# Patient Record
Sex: Female | Born: 1990 | Race: Black or African American | Hispanic: No | Smoking: Never smoker
Health system: Southern US, Community
[De-identification: ages and names within clinical notes are randomized; demographics above are authoritative.]

---

## 2013-09-30 ENCOUNTER — Emergency Department: Payer: Self-pay | Admitting: Emergency Medicine

## 2013-09-30 LAB — URINALYSIS, COMPLETE
Bilirubin,UR: NEGATIVE
Glucose,UR: NEGATIVE mg/dL (ref 0–75)
Nitrite: POSITIVE
Ph: 5 (ref 4.5–8.0)
Protein: 30
Specific Gravity: 1.034 (ref 1.003–1.030)
Squamous Epithelial: 3

## 2013-09-30 LAB — PREGNANCY, URINE: Pregnancy Test, Urine: NEGATIVE m[IU]/mL

## 2013-10-03 LAB — URINE CULTURE

## 2014-08-27 ENCOUNTER — Encounter (HOSPITAL_COMMUNITY): Payer: Self-pay | Admitting: Emergency Medicine

## 2014-08-27 ENCOUNTER — Inpatient Hospital Stay (HOSPITAL_COMMUNITY)
Admission: EM | Admit: 2014-08-27 | Discharge: 2014-09-05 | DRG: 957 | Disposition: A | Discharge status: Home / Self Care | Payer: 59 | Attending: Surgery | Admitting: Surgery

## 2014-08-27 DIAGNOSIS — D62 Acute posthemorrhagic anemia: Secondary | ICD-10-CM | POA: Diagnosis not present

## 2014-08-27 DIAGNOSIS — M546 Pain in thoracic spine: Secondary | ICD-10-CM

## 2014-08-27 DIAGNOSIS — R198 Other specified symptoms and signs involving the digestive system and abdomen: Secondary | ICD-10-CM

## 2014-08-27 DIAGNOSIS — S27329A Contusion of lung, unspecified, initial encounter: Secondary | ICD-10-CM | POA: Diagnosis present

## 2014-08-27 DIAGNOSIS — M79601 Pain in right arm: Secondary | ICD-10-CM | POA: Diagnosis present

## 2014-08-27 DIAGNOSIS — K659 Peritonitis, unspecified: Secondary | ICD-10-CM | POA: Diagnosis present

## 2014-08-27 DIAGNOSIS — M545 Low back pain, unspecified: Secondary | ICD-10-CM

## 2014-08-27 DIAGNOSIS — S36400A Unspecified injury of duodenum, initial encounter: Secondary | ICD-10-CM

## 2014-08-27 DIAGNOSIS — S36509A Unspecified injury of unspecified part of colon, initial encounter: Secondary | ICD-10-CM | POA: Diagnosis present

## 2014-08-27 DIAGNOSIS — S36420A Contusion of duodenum, initial encounter: Principal | ICD-10-CM | POA: Diagnosis present

## 2014-08-27 DIAGNOSIS — Z882 Allergy status to sulfonamides status: Secondary | ICD-10-CM

## 2014-08-27 DIAGNOSIS — S36520A Contusion of ascending [right] colon, initial encounter: Secondary | ICD-10-CM | POA: Diagnosis present

## 2014-08-27 DIAGNOSIS — K567 Ileus, unspecified: Secondary | ICD-10-CM | POA: Diagnosis present

## 2014-08-27 MED ORDER — SODIUM CHLORIDE 0.9 % IV BOLUS (SEPSIS)
1000.0000 mL | Freq: Once | INTRAVENOUS | Status: AC
Start: 2014-08-28 — End: 2014-08-28
  Administered 2014-08-28: 1000 mL via INTRAVENOUS

## 2014-08-27 MED ORDER — FENTANYL CITRATE (PF) 100 MCG/2ML IJ SOLN
50.0000 ug | Freq: Once | INTRAMUSCULAR | Status: AC
  Administered 2014-08-28: 50 ug via INTRAVENOUS
  Filled 2014-08-27: qty 2
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
## 2014-08-28 ENCOUNTER — Inpatient Hospital Stay (HOSPITAL_COMMUNITY): Payer: 59 | Admitting: Certified Registered"

## 2014-08-28 ENCOUNTER — Emergency Department (HOSPITAL_COMMUNITY): Payer: 59

## 2014-08-28 ENCOUNTER — Encounter (HOSPITAL_COMMUNITY): Admission: EM | Disposition: A | Discharge status: Home / Self Care | Payer: Self-pay | Source: Home / Self Care

## 2014-08-28 ENCOUNTER — Encounter (HOSPITAL_COMMUNITY): Payer: Self-pay | Admitting: Radiology

## 2014-08-28 DIAGNOSIS — Z882 Allergy status to sulfonamides status: Secondary | ICD-10-CM | POA: Diagnosis not present

## 2014-08-28 DIAGNOSIS — S36520A Contusion of ascending [right] colon, initial encounter: Secondary | ICD-10-CM | POA: Diagnosis present

## 2014-08-28 DIAGNOSIS — M79601 Pain in right arm: Secondary | ICD-10-CM | POA: Diagnosis present

## 2014-08-28 DIAGNOSIS — S27329A Contusion of lung, unspecified, initial encounter: Secondary | ICD-10-CM | POA: Diagnosis present

## 2014-08-28 DIAGNOSIS — D62 Acute posthemorrhagic anemia: Secondary | ICD-10-CM | POA: Diagnosis present

## 2014-08-28 DIAGNOSIS — M545 Low back pain: Secondary | ICD-10-CM | POA: Diagnosis present

## 2014-08-28 DIAGNOSIS — S36420A Contusion of duodenum, initial encounter: Secondary | ICD-10-CM | POA: Diagnosis present

## 2014-08-28 DIAGNOSIS — S36400A Unspecified injury of duodenum, initial encounter: Secondary | ICD-10-CM | POA: Diagnosis present

## 2014-08-28 DIAGNOSIS — K659 Peritonitis, unspecified: Secondary | ICD-10-CM | POA: Diagnosis present

## 2014-08-28 DIAGNOSIS — K567 Ileus, unspecified: Secondary | ICD-10-CM | POA: Diagnosis present

## 2014-08-28 HISTORY — PX: LAPAROTOMY: SHX154

## 2014-08-28 LAB — COMPREHENSIVE METABOLIC PANEL
ALBUMIN: 3.2 g/dL — AB (ref 3.5–5.0)
ALK PHOS: 71 U/L (ref 38–126)
ALK PHOS: 47 U/L (ref 38–126)
ALT: 59 U/L — AB (ref 14–54)
ALT: 54 U/L (ref 14–54)
ANION GAP: 13 (ref 5–15)
AST: 120 U/L — AB (ref 15–41)
AST: 125 U/L — ABNORMAL HIGH (ref 15–41)
Albumin: 4.1 g/dL (ref 3.5–5.0)
Anion gap: 8 (ref 5–15)
BUN: 9 mg/dL (ref 6–20)
BUN: 5 mg/dL — AB (ref 6–20)
CALCIUM: 8.9 mg/dL (ref 8.9–10.3)
CHLORIDE: 105 mmol/L (ref 101–111)
CO2: 19 mmol/L — ABNORMAL LOW (ref 22–32)
CO2: 22 mmol/L (ref 22–32)
Calcium: 7.6 mg/dL — ABNORMAL LOW (ref 8.9–10.3)
Chloride: 105 mmol/L (ref 101–111)
Creatinine, Ser: 0.89 mg/dL (ref 0.44–1.00)
Creatinine, Ser: 0.79 mg/dL (ref 0.44–1.00)
GFR calc non Af Amer: >60 mL/min (ref >60)
Glucose, Bld: 142 mg/dL — ABNORMAL HIGH (ref 65–99)
Glucose, Bld: 173 mg/dL — ABNORMAL HIGH (ref 65–99)
POTASSIUM: 3.7 mmol/L (ref 3.5–5.1)
Potassium: 2.7 mmol/L — CL (ref 3.5–5.1)
Sodium: 137 mmol/L (ref 135–145)
Sodium: 135 mmol/L (ref 135–145)
TOTAL PROTEIN: 5.2 g/dL — AB (ref 6.5–8.1)
Total Bilirubin: 0.3 mg/dL (ref 0.3–1.2)
Total Bilirubin: 1 mg/dL (ref 0.3–1.2)
Total Protein: 7.5 g/dL (ref 6.5–8.1)

## 2014-08-28 LAB — CBC
HCT: 33.4 % — ABNORMAL LOW (ref 36.0–46.0)
Hemoglobin: 11.9 g/dL — ABNORMAL LOW (ref 12.0–15.0)
MCH: 28.2 pg (ref 26.0–34.0)
MCHC: 35.6 g/dL (ref 30.0–36.0)
MCV: 79.1 fL (ref 78.0–100.0)
PLATELETS: 262 10*3/uL (ref 150–400)
RBC: 4.22 MIL/uL (ref 3.87–5.11)
RDW: 14.5 % (ref 11.5–15.5)
WBC: 15.2 10*3/uL — AB (ref 4.0–10.5)

## 2014-08-28 LAB — PROTIME-INR
INR: 1.11 (ref 0.00–1.49)
Prothrombin Time: 14.5 seconds (ref 11.6–15.2)

## 2014-08-28 LAB — URINE MICROSCOPIC-ADD ON

## 2014-08-28 LAB — URINALYSIS, ROUTINE W REFLEX MICROSCOPIC
Bilirubin Urine: NEGATIVE
GLUCOSE, UA: 100 mg/dL — AB
Ketones, ur: NEGATIVE mg/dL
Leukocytes, UA: NEGATIVE
Nitrite: NEGATIVE
Protein, ur: NEGATIVE mg/dL
Specific Gravity, Urine: 1.028 (ref 1.005–1.030)
Urobilinogen, UA: 0.2 mg/dL (ref 0.0–1.0)
pH: 7 (ref 5.0–8.0)

## 2014-08-28 LAB — CBC WITH DIFFERENTIAL/PLATELET
Basophils Absolute: 0 10*3/uL (ref 0.0–0.1)
Basophils Relative: 0 % (ref 0–1)
Eosinophils Absolute: 0.3 10*3/uL (ref 0.0–0.7)
Eosinophils Relative: 2 % (ref 0–5)
HEMATOCRIT: 35 % — AB (ref 36.0–46.0)
Hemoglobin: 12.6 g/dL (ref 12.0–15.0)
LYMPHS PCT: 38 % (ref 12–46)
Lymphs Abs: 6 10*3/uL — ABNORMAL HIGH (ref 0.7–4.0)
MCH: 28.3 pg (ref 26.0–34.0)
MCHC: 36 g/dL (ref 30.0–36.0)
MCV: 78.7 fL (ref 78.0–100.0)
MONOS PCT: 4 % (ref 3–12)
Monocytes Absolute: 0.6 10*3/uL (ref 0.1–1.0)
Neutro Abs: 9 10*3/uL — ABNORMAL HIGH (ref 1.7–7.7)
Neutrophils Relative %: 56 % (ref 43–77)
PLATELETS: 326 10*3/uL (ref 150–400)
RBC: 4.45 MIL/uL (ref 3.87–5.11)
RDW: 14.5 % (ref 11.5–15.5)
WBC: 15.9 10*3/uL — ABNORMAL HIGH (ref 4.0–10.5)

## 2014-08-28 LAB — I-STAT BETA HCG BLOOD, ED (MC, WL, AP ONLY)

## 2014-08-28 LAB — TROPONIN I: Troponin I: <0.03 ng/mL (ref <0.031)

## 2014-08-28 LAB — PREGNANCY, URINE: Preg Test, Ur: NEGATIVE

## 2014-08-28 LAB — APTT: APTT: 31 s (ref 24–37)

## 2014-08-28 SURGERY — LAPAROTOMY, EXPLORATORY
Anesthesia: General | Site: Abdomen

## 2014-08-28 MED ORDER — 0.9 % SODIUM CHLORIDE (POUR BTL) OPTIME
TOPICAL | Status: DC | PRN
  Administered 2014-08-28: 3000 mL

## 2014-08-28 MED ORDER — ONDANSETRON HCL 4 MG/2ML IJ SOLN
4.0000 mg | Freq: Four times a day (QID) | INTRAMUSCULAR | Status: DC | PRN
  Administered 2014-08-28 – 2014-09-01 (×4): 4 mg via INTRAVENOUS
  Filled 2014-08-28 (×4): qty 2

## 2014-08-28 MED ORDER — HYDROMORPHONE 0.3 MG/ML IV SOLN
INTRAVENOUS | Status: AC
Start: 2014-08-28 — End: 2014-08-28
  Filled 2014-08-28: qty 25

## 2014-08-28 MED ORDER — OXYCODONE HCL 5 MG PO TABS: 5.0000 mg | ORAL_TABLET | Freq: Once | ORAL | Status: DC | PRN

## 2014-08-28 MED ORDER — FENTANYL CITRATE (PF) 100 MCG/2ML IJ SOLN
INTRAMUSCULAR | Status: DC | PRN
  Administered 2014-08-28: 150 ug via INTRAVENOUS
  Administered 2014-08-28: 100 ug via INTRAVENOUS
  Administered 2014-08-28: 50 ug via INTRAVENOUS
  Administered 2014-08-28 (×2): 100 ug via INTRAVENOUS

## 2014-08-28 MED ORDER — NALOXONE HCL 0.4 MG/ML IJ SOLN: 0.4000 mg | INTRAMUSCULAR | Status: DC | PRN

## 2014-08-28 MED ORDER — ENOXAPARIN SODIUM 40 MG/0.4ML ~~LOC~~ SOLN
40.0000 mg | SUBCUTANEOUS | Status: DC
  Administered 2014-08-28 – 2014-09-04 (×8): 40 mg via SUBCUTANEOUS
  Filled 2014-08-28 (×8): qty 0.4

## 2014-08-28 MED ORDER — DIPHENHYDRAMINE HCL 50 MG/ML IJ SOLN
12.5000 mg | Freq: Four times a day (QID) | INTRAMUSCULAR | Status: DC | PRN
  Administered 2014-08-28 – 2014-09-01 (×4): 12.5 mg via INTRAVENOUS
  Filled 2014-08-28 (×4): qty 1

## 2014-08-28 MED ORDER — DEXTROSE IN LACTATED RINGERS 5 % IV SOLN: INTRAVENOUS | Status: DC

## 2014-08-28 MED ORDER — LACTATED RINGERS IV BOLUS (SEPSIS): 1000.0000 mL | Freq: Once | INTRAVENOUS | Status: DC

## 2014-08-28 MED ORDER — POTASSIUM CHLORIDE 10 MEQ/100ML IV SOLN: 10.0000 meq | INTRAVENOUS | Status: AC

## 2014-08-28 MED ORDER — FENTANYL CITRATE (PF) 250 MCG/5ML IJ SOLN
INTRAMUSCULAR | Status: AC
  Filled 2014-08-28: qty 5

## 2014-08-28 MED ORDER — SODIUM CHLORIDE 0.9 % IV BOLUS (SEPSIS)
1000.0000 mL | Freq: Once | INTRAVENOUS | Status: AC
  Administered 2014-08-28: 1000 mL via INTRAVENOUS

## 2014-08-28 MED ORDER — PROMETHAZINE HCL 25 MG/ML IJ SOLN
12.5000 mg | INTRAMUSCULAR | Status: DC | PRN
  Administered 2014-08-28 – 2014-09-01 (×4): 12.5 mg via INTRAVENOUS
  Filled 2014-08-28 (×4): qty 1

## 2014-08-28 MED ORDER — PIPERACILLIN-TAZOBACTAM 3.375 G IVPB
3.3750 g | Freq: Three times a day (TID) | INTRAVENOUS | Status: DC
  Administered 2014-08-28 – 2014-09-02 (×15): 3.375 g via INTRAVENOUS
  Filled 2014-08-28 (×16): qty 50

## 2014-08-28 MED ORDER — PIPERACILLIN-TAZOBACTAM 3.375 G IVPB 30 MIN
3.3750 g | Freq: Once | INTRAVENOUS | Status: AC
  Administered 2014-08-28: 3.375 g via INTRAVENOUS
  Filled 2014-08-28: qty 50

## 2014-08-28 MED ORDER — ONDANSETRON HCL 4 MG/2ML IJ SOLN: 4.0000 mg | Freq: Four times a day (QID) | INTRAMUSCULAR | Status: DC | PRN

## 2014-08-28 MED ORDER — ROCURONIUM BROMIDE 100 MG/10ML IV SOLN
INTRAVENOUS | Status: DC | PRN
  Administered 2014-08-28 (×2): 10 mg via INTRAVENOUS
  Administered 2014-08-28: 20 mg via INTRAVENOUS
  Administered 2014-08-28: 10 mg via INTRAVENOUS

## 2014-08-28 MED ORDER — SUCCINYLCHOLINE CHLORIDE 20 MG/ML IJ SOLN
INTRAMUSCULAR | Status: DC | PRN
  Administered 2014-08-28: 100 mg via INTRAVENOUS

## 2014-08-28 MED ORDER — OXYCODONE HCL 5 MG/5ML PO SOLN: 5.0000 mg | Freq: Once | ORAL | Status: DC | PRN

## 2014-08-28 MED ORDER — LIDOCAINE HCL (CARDIAC) 20 MG/ML IV SOLN
INTRAVENOUS | Status: DC | PRN
  Administered 2014-08-28: 60 mg via INTRAVENOUS

## 2014-08-28 MED ORDER — NEOSTIGMINE METHYLSULFATE 10 MG/10ML IV SOLN
INTRAVENOUS | Status: DC | PRN
  Administered 2014-08-28: 3 mg via INTRAVENOUS

## 2014-08-28 MED ORDER — LACTATED RINGERS IV SOLN
INTRAVENOUS | Status: DC | PRN
  Administered 2014-08-28 (×3): via INTRAVENOUS

## 2014-08-28 MED ORDER — GLYCOPYRROLATE 0.2 MG/ML IJ SOLN
INTRAMUSCULAR | Status: DC | PRN
  Administered 2014-08-28: 0.4 mg via INTRAVENOUS

## 2014-08-28 MED ORDER — ONDANSETRON HCL 4 MG/2ML IJ SOLN
INTRAMUSCULAR | Status: DC | PRN
  Administered 2014-08-28: 4 mg via INTRAVENOUS

## 2014-08-28 MED ORDER — SODIUM CHLORIDE 0.9 % IJ SOLN: 9.0000 mL | INTRAMUSCULAR | Status: DC | PRN

## 2014-08-28 MED ORDER — HYDROMORPHONE 0.3 MG/ML IV SOLN
INTRAVENOUS | Status: DC
  Administered 2014-08-28: 0.3 mg via INTRAVENOUS
  Administered 2014-08-28: 1.25 mg via INTRAVENOUS
  Administered 2014-08-28: 1.5 mg via INTRAVENOUS
  Administered 2014-08-28: 07:00:00 via INTRAVENOUS
  Administered 2014-08-29: 0.6 mg via INTRAVENOUS
  Administered 2014-08-29: 0.9 mg via INTRAVENOUS
  Administered 2014-08-29: 22:00:00 via INTRAVENOUS
  Administered 2014-08-29: 1.2 mg via INTRAVENOUS
  Administered 2014-08-29 (×2): 0.9 mg via INTRAVENOUS
  Administered 2014-08-30 (×2): 0.6 mg via INTRAVENOUS
  Administered 2014-08-30: 0.9 mg via INTRAVENOUS
  Administered 2014-08-30: 2.4 mg via INTRAVENOUS
  Administered 2014-08-30: 1.2 mg via INTRAVENOUS
  Administered 2014-08-31: 0.9 mg via INTRAVENOUS
  Administered 2014-08-31: 21:00:00 via INTRAVENOUS
  Administered 2014-08-31: 0.6 mg via INTRAVENOUS
  Administered 2014-08-31: 1.5 mg via INTRAVENOUS
  Administered 2014-08-31: 06:00:00 via INTRAVENOUS
  Administered 2014-08-31: 1.9 mg via INTRAVENOUS
  Administered 2014-08-31: 2.1 mg via INTRAVENOUS
  Administered 2014-09-01: 0.5 mg via INTRAVENOUS
  Administered 2014-09-01: 1 mg via INTRAVENOUS
  Administered 2014-09-01: 1.49 mg via INTRAVENOUS
  Administered 2014-09-01: 1.99 mg via INTRAVENOUS
  Administered 2014-09-01: 1.49 mg via INTRAVENOUS
  Administered 2014-09-02 (×2): 0.6 mg via INTRAVENOUS
  Filled 2014-08-28 (×5): qty 25

## 2014-08-28 MED ORDER — DIPHENHYDRAMINE HCL 12.5 MG/5ML PO ELIX: 12.5000 mg | ORAL_SOLUTION | Freq: Four times a day (QID) | ORAL | Status: DC | PRN

## 2014-08-28 MED ORDER — PHENOL 1.4 % MT LIQD
1.0000 | OROMUCOSAL | Status: DC | PRN
  Filled 2014-08-28: qty 177

## 2014-08-28 MED ORDER — HYDROMORPHONE HCL 1 MG/ML IJ SOLN
1.0000 mg | INTRAMUSCULAR | Status: DC | PRN
  Administered 2014-08-28: 1 mg via INTRAVENOUS
  Filled 2014-08-28: qty 1

## 2014-08-28 MED ORDER — SODIUM CHLORIDE 0.9 % IV SOLN: 12.5000 mg | INTRAVENOUS | Status: DC | PRN

## 2014-08-28 MED ORDER — HYDROMORPHONE HCL 1 MG/ML IJ SOLN
1.0000 mg | Freq: Once | INTRAMUSCULAR | Status: AC
  Administered 2014-08-28: 1 mg via INTRAVENOUS
  Filled 2014-08-28: qty 1

## 2014-08-28 MED ORDER — PROPOFOL 10 MG/ML IV BOLUS
INTRAVENOUS | Status: AC
  Filled 2014-08-28: qty 20

## 2014-08-28 MED ORDER — PROPOFOL 10 MG/ML IV BOLUS
INTRAVENOUS | Status: DC | PRN
  Administered 2014-08-28: 140 mg via INTRAVENOUS

## 2014-08-28 MED ORDER — IOHEXOL 300 MG/ML  SOLN
100.0000 mL | Freq: Once | INTRAMUSCULAR | Status: AC | PRN
  Administered 2014-08-28: 100 mL via INTRAVENOUS

## 2014-08-28 MED ORDER — HYDROMORPHONE HCL 1 MG/ML IJ SOLN: 0.2500 mg | INTRAMUSCULAR | Status: DC | PRN

## 2014-08-28 MED ORDER — KCL IN DEXTROSE-NACL 20-5-0.9 MEQ/L-%-% IV SOLN
INTRAVENOUS | Status: DC
  Administered 2014-08-28 – 2014-09-04 (×11): via INTRAVENOUS
  Administered 2014-09-05: 1 mL via INTRAVENOUS
  Filled 2014-08-28 (×20): qty 1000

## 2014-08-28 SURGICAL SUPPLY — 59 items
BNDG GAUZE ELAST 4 BULKY (GAUZE/BANDAGES/DRESSINGS) ×3 IMPLANT
CANISTER SUCTION 2500CC (MISCELLANEOUS) ×3 IMPLANT
CHLORAPREP W/TINT 26ML (MISCELLANEOUS) ×3 IMPLANT
COVER MAYO STAND STRL (DRAPES) IMPLANT
COVER SURGICAL LIGHT HANDLE (MISCELLANEOUS) ×3 IMPLANT
DRAIN CHANNEL 19F RND (DRAIN) ×3 IMPLANT
DRAPE LAPAROSCOPIC ABDOMINAL (DRAPES) ×3 IMPLANT
DRAPE PROXIMA HALF (DRAPES) IMPLANT
DRAPE WARM FLUID 44X44 (DRAPE) ×3 IMPLANT
DRSG OPSITE POSTOP 4X10 (GAUZE/BANDAGES/DRESSINGS) IMPLANT
DRSG OPSITE POSTOP 4X8 (GAUZE/BANDAGES/DRESSINGS) IMPLANT
ELECT BLADE 6.5 EXT (BLADE) IMPLANT
ELECT REM PT RETURN 9FT ADLT (ELECTROSURGICAL) ×3
ELECTRODE REM PT RTRN 9FT ADLT (ELECTROSURGICAL) ×1 IMPLANT
EVACUATOR SILICONE 100CC (DRAIN) ×3 IMPLANT
GLOVE BIO SURGEON STRL SZ8 (GLOVE) ×6 IMPLANT
GLOVE BIOGEL PI IND STRL 6.5 (GLOVE) ×1 IMPLANT
GLOVE BIOGEL PI IND STRL 7.5 (GLOVE) ×1 IMPLANT
GLOVE BIOGEL PI IND STRL 8 (GLOVE) ×2 IMPLANT
GLOVE BIOGEL PI INDICATOR 6.5 (GLOVE) ×2
GLOVE BIOGEL PI INDICATOR 7.5 (GLOVE) ×2
GLOVE BIOGEL PI INDICATOR 8 (GLOVE) ×4
GOWN STRL REUS W/ TWL LRG LVL3 (GOWN DISPOSABLE) ×2 IMPLANT
GOWN STRL REUS W/ TWL XL LVL3 (GOWN DISPOSABLE) ×1 IMPLANT
GOWN STRL REUS W/TWL LRG LVL3 (GOWN DISPOSABLE) ×4
GOWN STRL REUS W/TWL XL LVL3 (GOWN DISPOSABLE) ×2
KIT BASIN OR (CUSTOM PROCEDURE TRAY) ×3 IMPLANT
KIT ROOM TURNOVER OR (KITS) ×3 IMPLANT
LIGASURE IMPACT 36 18CM CVD LR (INSTRUMENTS) IMPLANT
NS IRRIG 1000ML POUR BTL (IV SOLUTION) ×6 IMPLANT
PACK GENERAL/GYN (CUSTOM PROCEDURE TRAY) ×3 IMPLANT
PAD ARMBOARD 7.5X6 YLW CONV (MISCELLANEOUS) ×3 IMPLANT
PENCIL BUTTON HOLSTER BLD 10FT (ELECTRODE) IMPLANT
RELOAD PROXIMATE 75MM BLUE (ENDOMECHANICALS) ×6 IMPLANT
SPECIMEN JAR LARGE (MISCELLANEOUS) IMPLANT
SPONGE GAUZE 4X4 12PLY STER LF (GAUZE/BANDAGES/DRESSINGS) ×3 IMPLANT
SPONGE LAP 18X18 X RAY DECT (DISPOSABLE) ×6 IMPLANT
STAPLER GUN LINEAR PROX 60 (STAPLE) ×3 IMPLANT
STAPLER PROXIMATE 75MM BLUE (STAPLE) ×3 IMPLANT
STAPLER VISISTAT 35W (STAPLE) ×3 IMPLANT
SUCTION POOLE TIP (SUCTIONS) ×3 IMPLANT
SUT ETHILON 2 0 FS 18 (SUTURE) ×3 IMPLANT
SUT PDS AB 1 TP1 54 (SUTURE) ×6 IMPLANT
SUT PDS AB 1 TP1 96 (SUTURE) ×6 IMPLANT
SUT SILK 2 0 SH CR/8 (SUTURE) IMPLANT
SUT SILK 2 0 TIES 10X30 (SUTURE) ×3 IMPLANT
SUT SILK 3 0 SH CR/8 (SUTURE) IMPLANT
SUT SILK 3 0 TIES 10X30 (SUTURE) ×3 IMPLANT
SUT VIC AB 2-0 SH 18 (SUTURE) IMPLANT
SUT VIC AB 3-0 SH 18 (SUTURE) ×18 IMPLANT
SUT VICRYL AB 2 0 TIES (SUTURE) IMPLANT
SUT VICRYL AB 3 0 TIES (SUTURE) IMPLANT
TAPE CLOTH SURG 6X10 WHT LF (GAUZE/BANDAGES/DRESSINGS) ×3 IMPLANT
TOWEL OR 17X24 6PK STRL BLUE (TOWEL DISPOSABLE) ×3 IMPLANT
TOWEL OR 17X26 10 PK STRL BLUE (TOWEL DISPOSABLE) ×3 IMPLANT
TRAY FOLEY CATH 14FRSI W/METER (CATHETERS) ×3 IMPLANT
TUBE CONNECTING 12'X1/4 (SUCTIONS)
TUBE CONNECTING 12X1/4 (SUCTIONS) IMPLANT
YANKAUER SUCT BULB TIP NO VENT (SUCTIONS) IMPLANT

## 2014-08-28 NOTE — Anesthesia Preprocedure Evaluation (Signed)
Anesthesia Evaluation  Patient identified by MRN, date of birth, ID band Patient awake    Reviewed: Allergy & Precautions, NPO status , Patient's Chart, lab work & pertinent test results  Airway Mallampati: I   Neck ROM: full    Dental   Pulmonary neg pulmonary ROS,  breath sounds clear to auscultation        Cardiovascular negative cardio ROS  Rhythm:regular Rate:Normal     Neuro/Psych    GI/Hepatic   Endo/Other    Renal/GU      Musculoskeletal   Abdominal   Peds  Hematology   Anesthesia Other Findings   Reproductive/Obstetrics                             Anesthesia Physical Anesthesia Plan  ASA: I and emergent  Anesthesia Plan: General   Post-op Pain Management:    Induction: Intravenous  Airway Management Planned: Oral ETT  Additional Equipment:   Intra-op Plan:   Post-operative Plan: Extubation in OR  Informed Consent: I have reviewed the patients History and Physical, chart, labs and discussed the procedure including the risks, benefits and alternatives for the proposed anesthesia with the patient or authorized representative who has indicated his/her understanding and acceptance.     Plan Discussed with: CRNA, Anesthesiologist and Surgeon  Anesthesia Plan Comments:         Anesthesia Quick Evaluation

## 2014-08-28 NOTE — ED Notes (Signed)
Patient transported to CT 

## 2014-08-28 NOTE — Anesthesia Procedure Notes (Signed)
Procedure Name: Intubation Date/Time: 08/28/2014 3:38 AM Performed by: Arlice ColtMANESS, Emanie Behan B Pre-anesthesia Checklist: Patient identified, Emergency Drugs available, Suction available, Patient being monitored and Timeout performed Patient Re-evaluated:Patient Re-evaluated prior to inductionOxygen Delivery Method: Circle system utilized Preoxygenation: Pre-oxygenation with 100% oxygen Intubation Type: IV induction, Rapid sequence and Cricoid Pressure applied Laryngoscope Size: Mac and 3 Grade View: Grade I Tube type: Oral Tube size: 7.5 mm Number of attempts: 1 Airway Equipment and Method: Stylet Placement Confirmation: ETT inserted through vocal cords under direct vision,  positive ETCO2 and breath sounds checked- equal and bilateral Secured at: 21 cm Tube secured with: Tape Dental Injury: Teeth and Oropharynx as per pre-operative assessment

## 2014-08-28 NOTE — ED Notes (Addendum)
Pt is vomiting, requesting nausea medication. Family at the bedside.

## 2014-08-28 NOTE — ED Notes (Signed)
CRITICAL VALUE ALERT  Critical value received:  Potasium 2.7
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
## 2014-08-28 NOTE — Transfer of Care (Signed)
Immediate Anesthesia Transfer of Care Note  Patient: Catherine Keith  Procedure(s) Performed: Procedure(s): EXPLORATORY LAPAROTOMY RIGHT COLON RESECTION (INCLUDING APPENDIX), REPAIR DUODENAL PERFORATION. (N/A)  Patient Location: PACU  Anesthesia Type:General  Level of Consciousness: awake, alert  and oriented  Airway & Oxygen Therapy: Patient Spontanous Breathing  Post-op Assessment: Report given to RN and Post -op Vital signs reviewed and stable  Post vital signs: Reviewed and stable  Last Vitals:  Filed Vitals:   08/28/14 0628  BP:   Pulse:   Temp: 36.6 C  Resp:     Complications: No apparent anesthesia complications

## 2014-08-28 NOTE — Anesthesia Postprocedure Evaluation (Signed)
  Anesthesia Post-op Note  Patient: Catherine Keith  Procedure(s) Performed: Procedure(s): EXPLORATORY LAPAROTOMY RIGHT COLON RESECTION (INCLUDING APPENDIX), REPAIR DUODENAL PERFORATION. (N/A)  Patient Location: PACU  Anesthesia Type:General  Level of Consciousness: awake and alert   Airway and Oxygen Therapy: Patient Spontanous Breathing  Post-op Pain: none  Post-op Assessment: Post-op Vital signs reviewed, Patient's Cardiovascular Status Stable and Respiratory Function Stable  Post-op Vital Signs: Reviewed  Filed Vitals:   08/28/14 0730  BP: 121/79  Pulse: 60  Temp:   Resp: 15    Complications: No apparent anesthesia complications
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
## 2014-08-29 NOTE — Progress Notes (Signed)
1 Day Post-Op  Subjective: Stable and alert. Complains of incisional pain. Denies shortness of breath. BP 110/70. Heart rate 90. Afebrile. SPO2 100%. Excellent urine output. 135 mL out JP drain. Thin, serosanguineous. Nonenteric. NG output low. Hemoglobin 11.9. WBC 15,200. Potassium 3.7. Glucose 173. Creatinine 0.79.  Objective: Vital signs in last 24 hours: Temp:  [97.5 F (36.4 C)-98.4 F (36.9 C)] 98 F (36.7 C) (05/30 0636) Pulse Rate:  [77-90] 90 (05/30 0636) Resp:  [10-19] 16 (05/30 0749) BP: (110-125)/(70-79) 110/70 mmHg (05/30 0636) SpO2:  [99 %-100 %] 100 % (05/30 0749) Last BM Date: 08/26/14  Intake/Output from previous day: 05/29 0701 - 05/30 0700 In: 1630 [I.V.:1420; NG/GT:60; IV Piggyback:150] Out: 1785 [Urine:1550; Emesis/NG output:100; Drains:135] Intake/Output this shift:    General appearance: Alert. Cooperative. Mental status normal. Mild to moderate distress from incisional pain. Resp: clear to auscultation bilaterally GI: Soft but appropriately tender. Wound clean and dry and intact. JP draining thin serosanguineous, nonenteric.  Lab Results:  No results found for this or any previous visit (from the past 24 hour(s)).   Studies/Results: No results found.  . enoxaparin (LOVENOX) injection  40 mg Subcutaneous Q24H  . HYDROmorphone PCA 0.3 mg/mL   Intravenous 6 times per day  . piperacillin-tazobactam (ZOSYN)  IV  3.375 g Intravenous 3 times per day     Assessment/Plan: s/p Procedure(s): EXPLORATORY LAPAROTOMY RIGHT COLON RESECTION (INCLUDING APPENDIX), REPAIR DUODENAL PERFORATION.  POD #1. Laparotomy, closure duodenal perforation with omental patch, right colectomy Stable Mobilize out of bed Incentive spirometry.  DVT prophylaxis-SCDs and Lovenox Check labs tomorrow  Consider Gastrografin upper GI proximally POD #4-5 before removing NG tube    @PROBHOSP @  LOS: 1 day    Catherine Keith M 08/29/2014  . .prob
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
## 2014-08-30 ENCOUNTER — Encounter (HOSPITAL_COMMUNITY): Payer: Self-pay | Admitting: Surgery

## 2014-08-30 DIAGNOSIS — D62 Acute posthemorrhagic anemia: Secondary | ICD-10-CM | POA: Diagnosis not present

## 2014-08-30 DIAGNOSIS — S36509A Unspecified injury of unspecified part of colon, initial encounter: Secondary | ICD-10-CM | POA: Diagnosis present

## 2014-08-30 LAB — BASIC METABOLIC PANEL
Anion gap: 8 (ref 5–15)
BUN: <5 mg/dL — ABNORMAL LOW (ref 6–20)
CALCIUM: 8.4 mg/dL — AB (ref 8.9–10.3)
CHLORIDE: 103 mmol/L (ref 101–111)
CO2: 24 mmol/L (ref 22–32)
CREATININE: 0.77 mg/dL (ref 0.44–1.00)
GFR calc Af Amer: >60 mL/min (ref >60)
GFR calc non Af Amer: >60 mL/min (ref >60)
Glucose, Bld: 94 mg/dL (ref 65–99)
Potassium: 3.6 mmol/L (ref 3.5–5.1)
Sodium: 135 mmol/L (ref 135–145)

## 2014-08-30 LAB — CBC
HEMATOCRIT: 31.2 % — AB (ref 36.0–46.0)
HEMOGLOBIN: 11.1 g/dL — AB (ref 12.0–15.0)
MCH: 27.8 pg (ref 26.0–34.0)
MCHC: 35.6 g/dL (ref 30.0–36.0)
MCV: 78 fL (ref 78.0–100.0)
Platelets: 229 10*3/uL (ref 150–400)
RBC: 4 MIL/uL (ref 3.87–5.11)
RDW: 14.4 % (ref 11.5–15.5)
WBC: 14.7 10*3/uL — AB (ref 4.0–10.5)
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
## 2014-08-30 NOTE — Clinical Social Work Note (Signed)
CSW received referral for SBIRT assessment.  Case discussed with case manager, and plan is to discharge home.  CSW to sign off please re-consult if social work needs arise.  Ervin KnackEric R. [place], MSW, [employer]CSWA [phone]

## 2014-08-31 LAB — CBC
HCT: 26.4 % — ABNORMAL LOW (ref 36.0–46.0)
HEMOGLOBIN: 9.4 g/dL — AB (ref 12.0–15.0)
MCH: 27.6 pg (ref 26.0–34.0)
MCHC: 35.6 g/dL (ref 30.0–36.0)
MCV: 77.6 fL — ABNORMAL LOW (ref 78.0–100.0)
Platelets: 216 10*3/uL (ref 150–400)
RBC: 3.4 MIL/uL — ABNORMAL LOW (ref 3.87–5.11)
RDW: 14.3 % (ref 11.5–15.5)
WBC: 12.7 10*3/uL — AB (ref 4.0–10.5)
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
## 2014-09-01 ENCOUNTER — Inpatient Hospital Stay (HOSPITAL_COMMUNITY): Payer: 59

## 2014-09-01 LAB — CBC
HEMATOCRIT: 26.1 % — AB (ref 36.0–46.0)
Hemoglobin: 9.2 g/dL — ABNORMAL LOW (ref 12.0–15.0)
MCH: 27.3 pg (ref 26.0–34.0)
MCHC: 35.2 g/dL (ref 30.0–36.0)
MCV: 77.4 fL — ABNORMAL LOW (ref 78.0–100.0)
PLATELETS: 246 10*3/uL (ref 150–400)
RBC: 3.37 MIL/uL — AB (ref 3.87–5.11)
RDW: 14.1 % (ref 11.5–15.5)
WBC: 8.3 10*3/uL (ref 4.0–10.5)

## 2014-09-01 MED ORDER — IOHEXOL 300 MG/ML  SOLN
150.0000 mL | Freq: Once | INTRAMUSCULAR | Status: AC | PRN
  Administered 2014-09-01: 120 mL via ORAL

## 2014-09-01 NOTE — Care Management Note (Signed)
Case Management Note  Patient Details  Name: Ceasar LundJasmine Habeeb MRN: [id number] Date of Birth: 11/05/1990  Subjective/Objective:       Pt progressing well; NG discontinued today.               Action/Plan: Will follow for dc needs.  Pt to dc home with family at dc.    Expected Discharge Date:                  Expected Discharge Plan:  Home/Self Care  In-House Referral:     Discharge planning Services  CM Consult  Post Acute Care Choice:    Choice offered to:     DME Arranged:    DME Agency:     HH Arranged:    HH Agency:     Status of Service:  In process, will continue to follow  Medicare Important Message Given:    Date Medicare IM Given:    Medicare IM give by:    Date Additional Medicare IM Given:    Additional Medicare Important Message give by:     If discussed at Long Length of Stay Meetings, dates discussed:    Additional Comments:  Quintella BatonJulie W. Takeysha Bonk, RN, BSN  Trauma/Neuro ICU Case Manager [phone]

## 2014-09-01 NOTE — Progress Notes (Signed)
Pt was referred by Nurse. Chaplain spoke with Pt regarding her needs. Pt stated she had none. Chaplain listened to Pt briefly about her condition. Chaplain will follow up.   09/01/14 1400  Clinical Encounter Type  Visited With Patient  Visit Type Spiritual support  Referral From Nurse  Spiritual Encounters  Spiritual Needs Emotional  ow up
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
## 2014-09-02 MED ORDER — ONDANSETRON HCL 4 MG PO TABS
4.0000 mg | ORAL_TABLET | ORAL | Status: DC | PRN
  Administered 2014-09-04 – 2014-09-05 (×5): 4 mg via ORAL
  Filled 2014-09-02 (×5): qty 1

## 2014-09-02 MED ORDER — POLYETHYLENE GLYCOL 3350 17 G PO PACK
17.0000 g | PACK | Freq: Every day | ORAL | Status: DC
  Administered 2014-09-02 – 2014-09-04 (×3): 17 g via ORAL
  Filled 2014-09-02 (×3): qty 1

## 2014-09-02 MED ORDER — HYDROMORPHONE HCL 1 MG/ML IJ SOLN
0.5000 mg | INTRAMUSCULAR | Status: DC | PRN
  Administered 2014-09-02 – 2014-09-04 (×2): 0.5 mg via INTRAVENOUS
  Filled 2014-09-02 (×2): qty 1

## 2014-09-02 MED ORDER — OXYCODONE HCL 5 MG PO TABS
5.0000 mg | ORAL_TABLET | ORAL | Status: DC | PRN
  Administered 2014-09-02 (×3): 10 mg via ORAL
  Administered 2014-09-03 – 2014-09-04 (×10): 15 mg via ORAL
  Administered 2014-09-05: 10 mg via ORAL
  Administered 2014-09-05: 15 mg via ORAL
  Filled 2014-09-02 (×6): qty 3
  Filled 2014-09-02: qty 2
  Filled 2014-09-02 (×5): qty 3
  Filled 2014-09-02: qty 2
  Filled 2014-09-02 (×3): qty 3

## 2014-09-02 MED ORDER — ONDANSETRON HCL 4 MG/2ML IJ SOLN
4.0000 mg | INTRAMUSCULAR | Status: DC | PRN
  Administered 2014-09-03 – 2014-09-04 (×4): 4 mg via INTRAVENOUS
  Filled 2014-09-02 (×6): qty 2

## 2014-09-02 MED ORDER — DOCUSATE SODIUM 100 MG PO CAPS
100.0000 mg | ORAL_CAPSULE | Freq: Two times a day (BID) | ORAL | Status: DC
  Administered 2014-09-02 – 2014-09-05 (×7): 100 mg via ORAL
  Filled 2014-09-02 (×8): qty 1
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
## 2014-09-05 ENCOUNTER — Encounter: Payer: Self-pay | Admitting: Orthopedic Surgery

## 2014-09-05 LAB — CBC WITH DIFFERENTIAL/PLATELET
BASOS ABS: 0 10*3/uL (ref 0.0–0.1)
Basophils Relative: 0 % (ref 0–1)
EOS ABS: 0.4 10*3/uL (ref 0.0–0.7)
Eosinophils Relative: 6 % — ABNORMAL HIGH (ref 0–5)
HCT: 26.7 % — ABNORMAL LOW (ref 36.0–46.0)
HEMOGLOBIN: 9.4 g/dL — AB (ref 12.0–15.0)
LYMPHS ABS: 3.1 10*3/uL (ref 0.7–4.0)
Lymphocytes Relative: 45 % (ref 12–46)
MCH: 27.3 pg (ref 26.0–34.0)
MCHC: 35.2 g/dL (ref 30.0–36.0)
MCV: 77.6 fL — ABNORMAL LOW (ref 78.0–100.0)
MONO ABS: 0.7 10*3/uL (ref 0.1–1.0)
Monocytes Relative: 9 % (ref 3–12)
Neutro Abs: 2.8 10*3/uL (ref 1.7–7.7)
Neutrophils Relative %: 40 % — ABNORMAL LOW (ref 43–77)
Platelets: 302 10*3/uL (ref 150–400)
RBC: 3.44 MIL/uL — ABNORMAL LOW (ref 3.87–5.11)
RDW: 14.5 % (ref 11.5–15.5)
WBC: 7 10*3/uL (ref 4.0–10.5)

## 2014-09-05 MED ORDER — ONDANSETRON HCL 4 MG PO TABS: 4.0000 mg | ORAL_TABLET | ORAL | Status: DC | PRN

## 2014-09-05 MED ORDER — OXYCODONE-ACETAMINOPHEN 7.5-325 MG PO TABS: 1.0000 | ORAL_TABLET | ORAL | Status: DC | PRN

## 2014-09-05 NOTE — Discharge Instructions (Signed)
Wet-to-dry dressing to abdominal wound twice daily.  It's ok to shower with the dressing off; don't soak wound in bathtub, etc.  Empty drain as needed and record how much. We will use that information to decide when the drain is safe to remove.  No driving while taking oxycodone.  No lifting more than 5 pounds for 6 weeks.
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
## 2014-09-05 NOTE — Progress Notes (Signed)
Catherine LundJasmine Keith to be D/C'd Home per MD order.  Discussed with the patient and all questions fully answered.  VSS, Surgical dressing changed prior to discharge and is clean dry and intact. IV catheter discontinued intact. Site without signs and symptoms of complications. Dressing and pressure applied.  An After Visit Summary was printed and given to the patient. Patient received prescription.  D/c education completed with patient/family including follow up instructions, medication list, d/c activities limitations if indicated, with other d/c instructions as indicated by MD - patient able to verbalize understanding, all questions fully answered.   Patient instructed to return to ED, call 911, or call MD for any changes in condition.   Patient escorted via WC, and D/C home via private auto.  09/05/2014 11:15 Elease HashimotoKristie Maree Ainley

## 2014-09-19 ENCOUNTER — Other Ambulatory Visit (HOSPITAL_COMMUNITY): Payer: Self-pay

## 2014-09-19 MED ORDER — PROMETHAZINE HCL 12.5 MG PO TABS: 12.5000 mg | ORAL_TABLET | Freq: Four times a day (QID) | ORAL | Status: DC | PRN

## 2014-09-19 MED ORDER — OXYCODONE-ACETAMINOPHEN 5-325 MG PO TABS: 1.0000 | ORAL_TABLET | ORAL | Status: DC | PRN

## 2014-09-19 NOTE — Telephone Encounter (Signed)
Refilled pt's pain meds (BT approved) and wrote rx for phenergan as mom said Zofran wasn't working as well.

## 2015-01-19 ENCOUNTER — Encounter (HOSPITAL_COMMUNITY): Payer: Self-pay | Admitting: Emergency Medicine

## 2015-01-19 ENCOUNTER — Emergency Department (INDEPENDENT_AMBULATORY_CARE_PROVIDER_SITE_OTHER)
Admission: EM | Admit: 2015-01-19 | Discharge: 2015-01-19 | Disposition: A | Discharge status: Home / Self Care | Payer: 59 | Source: Home / Self Care | Attending: Family Medicine | Admitting: Family Medicine

## 2015-01-19 DIAGNOSIS — J324 Chronic pansinusitis: Secondary | ICD-10-CM

## 2015-01-19 LAB — POCT RAPID STREP A: STREPTOCOCCUS, GROUP A SCREEN (DIRECT): NEGATIVE

## 2015-01-19 MED ORDER — AMOXICILLIN-POT CLAVULANATE 875-125 MG PO TABS: 1.0000 | ORAL_TABLET | Freq: Two times a day (BID) | ORAL | Status: DC

## 2015-01-19 MED ORDER — FLUTICASONE PROPIONATE 50 MCG/ACT NA SUSP: 2.0000 | Freq: Every day | NASAL | Status: DC

## 2015-01-19 MED ORDER — IPRATROPIUM BROMIDE 0.06 % NA SOLN: 2.0000 | Freq: Four times a day (QID) | NASAL | Status: DC

## 2015-01-19 MED ORDER — FLUCONAZOLE 150 MG PO TABS: 150.0000 mg | ORAL_TABLET | Freq: Every day | ORAL | Status: DC

## 2015-01-19 NOTE — ED Notes (Signed)
C/o sore throat and left ear pain States she has hx of strep States she does have facial pressure Tea and spray used as tx

## 2015-01-19 NOTE — Discharge Instructions (Signed)
You likely have developed a sinus infection which is causing part by a virus, allergies, and now bacteria. This will likely require antibiotics in order to clear. Please use the Diflucan if you develop a vaginal yeast infection. Please use ibuprofen 600 mg 800 mg every 6-8 hours for additional pain relief. This will also help with the eustachian tube swelling. Please use the Flonase to help decrease your sinus swelling and use the Atrovent to help clear your Nasal passages.

## 2015-01-19 NOTE — ED Provider Notes (Signed)
CSN: [id number]     Arrival date & time 01/19/15  1425 History   First MD Initiated Contact with Patient 01/19/15 1444     Chief Complaint  Patient presents with  . Sore Throat   (Consider location/radiation/quality/duration/timing/severity/associated sxs/prior Treatment) HPI  Throat pain and sinus congestion. Ongoing for 2 weeks. Gradually getting worse. Associated with left ear pain for last 2 days. Has baseline allergies but states that this is different. Intermittent sneezing. Now body aches and emesis 1. Denies any rash. Denies sick contacts. Denies any chest congestion, chest pain, shortness breath, palpitations, rash, bone pain, dysuria, frequency, back pain.    History reviewed. No pertinent past medical history. Past Surgical History  Procedure Laterality Date  . Laparotomy N/A 08/28/2014    Procedure: EXPLORATORY LAPAROTOMY RIGHT COLON RESECTION (INCLUDING APPENDIX), REPAIR DUODENAL PERFORATION.;  Surgeon: Harriette Bouillon, MD;  Location: [facility] OR;  Service: General;  Laterality: N/A;   Family History  Problem Relation Age of Onset  . Cancer Neg Hx   . Diabetes Neg Hx   . Hyperlipidemia Neg Hx   . Heart failure Neg Hx   . Hypertension Neg Hx   . Migraines Neg Hx    Social History  Substance Use Topics  . Smoking status: Never Smoker   . Smokeless tobacco: None  . Alcohol Use: No   OB History    No data available     Review of Systems Per HPI with all other pertinent systems negative.   Allergies  Sulfa antibiotics  Home Medications   Prior to Admission medications   Medication Sig Start Date End Date Taking? Authorizing Provider  amoxicillin-clavulanate (AUGMENTIN) 875-125 MG tablet Take 1 tablet by mouth 2 (two) times daily. 01/19/15   Ozella Rocks, MD  fluconazole (DIFLUCAN) 150 MG tablet Take 1 tablet (150 mg total) by mouth daily. Repeat dose in 3 days 01/19/15   Ozella Rocks, MD  fluticasone [facility]) 50 MCG/ACT nasal spray Place 2 sprays into both  nostrils at bedtime. 01/19/15   Ozella Rocks, MD  ipratropium (ATROVENT) 0.06 % nasal spray Place 2 sprays into both nostrils 4 (four) times daily. 01/19/15   Ozella Rocks, MD   Meds Ordered and Administered this Visit  Medications - No data to display  BP 109/82 mmHg  Pulse 88  Temp(Src) 98.1 F (36.7 C) (Oral)  Resp 16  SpO2 100%  LMP 12/20/2014 No data found.   Physical Exam Physical Exam  Constitutional: oriented to person, place, and time. appears well-developed and well-nourished. No distress.  HENT:  Head: Normocephalic and atraumatic.  Frontal and maxillary sinuses tender to palpation, boggy nasal turbinates, pharyngeal injection. Minimal anterior left cervical lymphadenopathy. Eyes: EOMI. PERRL.  Neck: Normal range of motion.  Cardiovascular: RRR, no m/r/g, 2+ distal pulses,  Pulmonary/Chest: Effort normal and breath sounds normal. No respiratory distress.  Abdominal: Soft. Bowel sounds are normal. NonTTP, no distension.  Musculoskeletal: Normal range of motion. Non ttp, no effusion.  Neurological: alert and oriented to person, place, and time.  Skin: Skin is warm. No rash noted. non diaphoretic.  Psychiatric: normal mood and affect. behavior is normal. Judgment and thought content normal.   ED Course  Procedures (including critical care time)  Labs Review Labs Reviewed  POCT RAPID STREP A    Imaging Review No results found.   Visual Acuity Review  Right Eye Distance:   Left Eye Distance:   Bilateral Distance:    Right Eye Near:   Left Eye  Near:    Bilateral Near:         MDM   1. Pansinusitis, unspecified chronicity    Augmentin, his Atrovent, Flonase, Zyrtec, nasal saline, ibuprofen.    Ozella Rocksavid J Liseth Wann, MD 01/19/15 [phone]

## 2015-01-21 LAB — CULTURE, GROUP A STREP: Strep A Culture: NEGATIVE

## 2015-01-24 NOTE — ED Notes (Signed)
Final report of strep negative  

## 2015-03-29 ENCOUNTER — Encounter (HOSPITAL_COMMUNITY): Payer: Self-pay | Admitting: *Deleted

## 2015-03-29 ENCOUNTER — Emergency Department (HOSPITAL_COMMUNITY)
Admission: EM | Admit: 2015-03-29 | Discharge: 2015-03-29 | Disposition: A | Discharge status: Home / Self Care | Payer: 59 | Attending: Emergency Medicine | Admitting: Emergency Medicine

## 2015-03-29 DIAGNOSIS — Z87828 Personal history of other (healed) physical injury and trauma: Secondary | ICD-10-CM | POA: Diagnosis not present

## 2015-03-29 DIAGNOSIS — R110 Nausea: Secondary | ICD-10-CM | POA: Insufficient documentation

## 2015-03-29 DIAGNOSIS — R1084 Generalized abdominal pain: Secondary | ICD-10-CM | POA: Insufficient documentation

## 2015-03-29 DIAGNOSIS — R109 Unspecified abdominal pain: Secondary | ICD-10-CM | POA: Diagnosis present

## 2015-03-29 DIAGNOSIS — Z3202 Encounter for pregnancy test, result negative: Secondary | ICD-10-CM | POA: Insufficient documentation

## 2015-03-29 LAB — CBC
HCT: 33.2 % — ABNORMAL LOW (ref 36.0–46.0)
Hemoglobin: 11.5 g/dL — ABNORMAL LOW (ref 12.0–15.0)
MCH: 28.3 pg (ref 26.0–34.0)
MCHC: 34.6 g/dL (ref 30.0–36.0)
MCV: 81.6 fL (ref 78.0–100.0)
Platelets: 244 10*3/uL (ref 150–400)
RBC: 4.07 MIL/uL (ref 3.87–5.11)
RDW: 14.7 % (ref 11.5–15.5)
WBC: 6.7 10*3/uL (ref 4.0–10.5)

## 2015-03-29 LAB — COMPREHENSIVE METABOLIC PANEL
ALBUMIN: 3.9 g/dL (ref 3.5–5.0)
ALT: 12 U/L — AB (ref 14–54)
AST: 19 U/L (ref 15–41)
Alkaline Phosphatase: 56 U/L (ref 38–126)
Anion gap: 6 (ref 5–15)
BUN: 8 mg/dL (ref 6–20)
CHLORIDE: 107 mmol/L (ref 101–111)
CO2: 26 mmol/L (ref 22–32)
CREATININE: 0.76 mg/dL (ref 0.44–1.00)
Calcium: 9 mg/dL (ref 8.9–10.3)
GFR calc non Af Amer: >60 mL/min (ref >60)
GLUCOSE: 61 mg/dL — AB (ref 65–99)
Potassium: 3.5 mmol/L (ref 3.5–5.1)
SODIUM: 139 mmol/L (ref 135–145)
Total Bilirubin: 0.7 mg/dL (ref 0.3–1.2)
Total Protein: 6.6 g/dL (ref 6.5–8.1)

## 2015-03-29 LAB — I-STAT BETA HCG BLOOD, ED (MC, WL, AP ONLY): I-stat hCG, quantitative: <5 m[IU]/mL (ref <5)

## 2015-03-29 LAB — LIPASE, BLOOD: LIPASE: 19 U/L (ref 11–51)

## 2015-03-29 MED ORDER — ONDANSETRON HCL 4 MG PO TABS: 4.0000 mg | ORAL_TABLET | Freq: Three times a day (TID) | ORAL | Status: DC | PRN

## 2015-03-29 MED ORDER — PSYLLIUM 0.52 G PO CAPS: 0.5200 g | ORAL_CAPSULE | Freq: Every day | ORAL | Status: DC

## 2015-03-29 MED ORDER — ONDANSETRON HCL 4 MG PO TABS
4.0000 mg | ORAL_TABLET | Freq: Once | ORAL | Status: AC
  Administered 2015-03-29: 4 mg via ORAL
  Filled 2015-03-29: qty 1

## 2015-03-29 NOTE — Discharge Instructions (Signed)

## 2015-03-29 NOTE — ED Notes (Signed)
MD at bedside. 

## 2015-03-29 NOTE — ED Notes (Signed)
Patient brought back to room; patient getting undressed and into a gown at this time
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
## 2015-04-03 ENCOUNTER — Emergency Department (HOSPITAL_COMMUNITY): Payer: BLUE CROSS/BLUE SHIELD

## 2015-04-03 ENCOUNTER — Emergency Department (HOSPITAL_COMMUNITY)
Admission: EM | Admit: 2015-04-03 | Discharge: 2015-04-03 | Disposition: A | Discharge status: Home / Self Care | Payer: BLUE CROSS/BLUE SHIELD | Attending: Emergency Medicine | Admitting: Emergency Medicine

## 2015-04-03 ENCOUNTER — Encounter (HOSPITAL_COMMUNITY): Payer: Self-pay

## 2015-04-03 DIAGNOSIS — R69 Illness, unspecified: Secondary | ICD-10-CM

## 2015-04-03 DIAGNOSIS — J111 Influenza due to unidentified influenza virus with other respiratory manifestations: Secondary | ICD-10-CM

## 2015-04-03 DIAGNOSIS — J029 Acute pharyngitis, unspecified: Secondary | ICD-10-CM | POA: Diagnosis present

## 2015-04-03 DIAGNOSIS — Z3202 Encounter for pregnancy test, result negative: Secondary | ICD-10-CM | POA: Insufficient documentation

## 2015-04-03 LAB — COMPREHENSIVE METABOLIC PANEL
ALT: 14 U/L (ref 14–54)
AST: 26 U/L (ref 15–41)
Albumin: 3.8 g/dL (ref 3.5–5.0)
Alkaline Phosphatase: 46 U/L (ref 38–126)
Anion gap: 12 (ref 5–15)
BUN: 10 mg/dL (ref 6–20)
CO2: 22 mmol/L (ref 22–32)
Calcium: 9 mg/dL (ref 8.9–10.3)
Chloride: 101 mmol/L (ref 101–111)
Creatinine, Ser: 0.8 mg/dL (ref 0.44–1.00)
GFR calc Af Amer: >60 mL/min (ref >60)
GFR calc non Af Amer: >60 mL/min (ref >60)
Glucose, Bld: 74 mg/dL (ref 65–99)
Potassium: 3.6 mmol/L (ref 3.5–5.1)
Sodium: 135 mmol/L (ref 135–145)
Total Bilirubin: 0.7 mg/dL (ref 0.3–1.2)
Total Protein: 7.2 g/dL (ref 6.5–8.1)

## 2015-04-03 LAB — CBC WITH DIFFERENTIAL/PLATELET
Basophils Absolute: 0 10*3/uL (ref 0.0–0.1)
Basophils Relative: 1 %
Eosinophils Absolute: 0 10*3/uL (ref 0.0–0.7)
Eosinophils Relative: 0 %
HCT: 35.6 % — ABNORMAL LOW (ref 36.0–46.0)
Hemoglobin: 12.8 g/dL (ref 12.0–15.0)
Lymphocytes Relative: 21 %
Lymphs Abs: 1.1 10*3/uL (ref 0.7–4.0)
MCH: 28.6 pg (ref 26.0–34.0)
MCHC: 36 g/dL (ref 30.0–36.0)
MCV: 79.5 fL (ref 78.0–100.0)
Monocytes Absolute: 0.6 10*3/uL (ref 0.1–1.0)
Monocytes Relative: 12 %
Neutro Abs: 3.6 10*3/uL (ref 1.7–7.7)
Neutrophils Relative %: 66 %
Platelets: 215 10*3/uL (ref 150–400)
RBC: 4.48 MIL/uL (ref 3.87–5.11)
RDW: 14.2 % (ref 11.5–15.5)
WBC: 5.4 10*3/uL (ref 4.0–10.5)

## 2015-04-03 LAB — URINALYSIS, ROUTINE W REFLEX MICROSCOPIC
GLUCOSE, UA: NEGATIVE mg/dL
HGB URINE DIPSTICK: NEGATIVE
LEUKOCYTES UA: NEGATIVE
Nitrite: NEGATIVE
PROTEIN: NEGATIVE mg/dL
Specific Gravity, Urine: 1.025 (ref 1.005–1.030)
pH: 5.5 (ref 5.0–8.0)

## 2015-04-03 LAB — I-STAT BETA HCG BLOOD, ED (MC, WL, AP ONLY)

## 2015-04-03 MED ORDER — MORPHINE SULFATE (PF) 4 MG/ML IV SOLN
4.0000 mg | Freq: Once | INTRAVENOUS | Status: AC
  Administered 2015-04-03: 4 mg via INTRAVENOUS
  Filled 2015-04-03: qty 1

## 2015-04-03 MED ORDER — GUAIFENESIN ER 1200 MG PO TB12: 1.0000 | ORAL_TABLET | Freq: Two times a day (BID) | ORAL | Status: DC

## 2015-04-03 MED ORDER — SODIUM CHLORIDE 0.9 % IV BOLUS (SEPSIS)
1000.0000 mL | Freq: Once | INTRAVENOUS | Status: AC
  Administered 2015-04-03: 1000 mL via INTRAVENOUS

## 2015-04-03 MED ORDER — PROMETHAZINE-DM 6.25-15 MG/5ML PO SYRP: 5.0000 mL | ORAL_SOLUTION | Freq: Four times a day (QID) | ORAL | Status: DC | PRN

## 2015-04-03 MED ORDER — IBUPROFEN 800 MG PO TABS: 800.0000 mg | ORAL_TABLET | Freq: Three times a day (TID) | ORAL | Status: AC | PRN

## 2015-04-03 NOTE — ED Notes (Signed)
Returns from xray

## 2015-04-03 NOTE — Discharge Instructions (Signed)
Return here as needed.  Follow-up with your primary care Dr. increase your fluid intake, rest as much possible °

## 2015-04-03 NOTE — ED Notes (Signed)
Patient here with cough, sore throat, abdominal pain since Friday, no distress, no nausea, no vomiting

## 2015-04-03 NOTE — ED Provider Notes (Addendum)
CSN: [id number]     Arrival date & time 04/03/15  [id number] History   First MD Initiated Contact with Patient 04/03/15 1007     Chief Complaint  Patient presents with  . cough, sore throat, abdominal pain      (Consider location/radiation/quality/duration/timing/severity/associated sxs/prior Treatment) HPI Patient presents to the emergency department with nasal congestion, cough, runny nose, sore throat, abdominal discomfort, nausea and body aches that started 7 days ago.  The patient states her symptoms got worse over that time frame.  Patient states she did not take any medications prior to arrival.  She states that nothing seems to make her condition better or worse.  Patient denies chest pain, shortness of breath, dizziness, headache, blurred vision, back pain, neck pain, fever, lightheadedness, near syncope or syncope.  Patient states that she did not have any the counter medications History reviewed. No pertinent past medical history. Past Surgical History  Procedure Laterality Date  . Laparotomy N/A 08/28/2014    Procedure: EXPLORATORY LAPAROTOMY RIGHT COLON RESECTION (INCLUDING APPENDIX), REPAIR DUODENAL PERFORATION.;  Surgeon: Harriette Bouillonhomas Cornett, MD;  Location: [facility] OR;  Service: General;  Laterality: N/A;   Family History  Problem Relation Age of Onset  . Cancer Neg Hx   . Diabetes Neg Hx   . Hyperlipidemia Neg Hx   . Heart failure Neg Hx   . Hypertension Neg Hx   . Migraines Neg Hx    Social History  Substance Use Topics  . Smoking status: Never Smoker   . Smokeless tobacco: None  . Alcohol Use: No   OB History    No data available     Review of Systems  All other systems negative except as documented in the HPI. All pertinent positives and negatives as reviewed in the HPI.   Allergies  Sulfa antibiotics  Home Medications   Prior to Admission medications   Medication Sig Start Date End Date Taking? Authorizing Provider  AMOXICILLIN PO Take 1 capsule by mouth as needed.    Yes Historical Provider, MD  diphenhydrAMINE (BENADRYL) 25 MG tablet Take 12.5 mg by mouth every 6 (six) hours as needed for allergies.   Yes Historical Provider, MD  ibuprofen (ADVIL,MOTRIN) 200 MG tablet Take 400 mg by mouth every 8 (eight) hours as needed (pain).   Yes Historical Provider, MD  ondansetron (ZOFRAN) 4 MG tablet Take 1 tablet (4 mg total) by mouth every 8 (eight) hours as needed for nausea or vomiting. 03/29/15  Yes Rachelle HoraKeri Smith, MD  psyllium (METAMUCIL) 0.52 g capsule Take 1 capsule (0.52 g total) by mouth daily. Patient not taking: Reported on 04/03/2015 03/29/15   Rachelle HoraKeri Smith, MD   BP 96/54 mmHg  Pulse 71  Temp(Src) 98.6 F (37 C) (Oral)  Resp 18  Ht 5\' 3"  (1.6 m)  Wt 58.968 kg  BMI 23.03 kg/m2  SpO2 99%  LMP 03/22/2015 Physical Exam  Constitutional: She is oriented to person, place, and time. She appears well-developed and well-nourished. No distress.  HENT:  Head: Normocephalic and atraumatic.  Mouth/Throat: Uvula is midline. Posterior oropharyngeal erythema present.  Eyes: Pupils are equal, round, and reactive to light.  Neck: Normal range of motion. Neck supple.  Cardiovascular: Normal rate, regular rhythm and normal heart sounds.  Exam reveals no gallop and no friction rub.   No murmur heard. Pulmonary/Chest: Effort normal and breath sounds normal. No respiratory distress. She has no wheezes.  Abdominal: Soft. Bowel sounds are normal. She exhibits no distension. There is no tenderness.  Neurological:  She is alert and oriented to person, place, and time. She exhibits normal muscle tone. Coordination normal.  Skin: Skin is warm and dry. No rash noted. No erythema.  Psychiatric: She has a normal mood and affect. Her behavior is normal.  Nursing note and vitals reviewed.   ED Course  Procedures (including critical care time) Labs Review Labs Reviewed  CBC WITH DIFFERENTIAL/PLATELET - Abnormal; Notable for the following:    HCT 35.6 (*)    All other components  within normal limits  URINALYSIS, ROUTINE W REFLEX MICROSCOPIC (NOT AT [facility]) - Abnormal; Notable for the following:    APPearance CLOUDY (*)    Bilirubin Urine SMALL (*)    Ketones, ur >80 (*)    All other components within normal limits  COMPREHENSIVE METABOLIC PANEL  I-STAT BETA HCG BLOOD, ED ([facility], [facility], [facility] ONLY)    Imaging Review Dg Abd Acute W/chest  04/03/2015  CLINICAL DATA:  25 year old female with left upper abdominal pain for 1 week who. Nausea. EXAM: DG ABDOMEN ACUTE W/ 1V CHEST COMPARISON:  Chest x-ray 08/28/2014. FINDINGS: Lung volumes are normal. No consolidative airspace disease. No pleural effusions. No pneumothorax. No pulmonary nodule or mass noted. Pulmonary vasculature and the cardiomediastinal silhouette are within normal limits. Gas and stool are seen scattered throughout the colon extending to the level of the distal rectum. No pathologic distension of small bowel is noted. No gross evidence of pneumoperitoneum. Surgical sutures are noted in the right side of the mid abdomen. IMPRESSION: 1.  Nonobstructive bowel gas pattern. 2. No pneumoperitoneum. 3. No radiographic evidence of acute cardiopulmonary disease. Electronically Signed   By: Trudie Reed M.D.   On: 04/03/2015 13:56   I have personally reviewed and evaluated these images and lab results as part of my medical decision-making.  Patient most likely has a viral influenza-like illness will treat for this given her fluids here in the emergency room.  She is feeling better at this time.  Patient will be advised to return here as needed.normal vital signs per her previous records    Charlestine Night, [place] 04/03/15 1448  Pricilla Loveless, MD 04/04/15 0740  Charlestine Night, PA-C 04/12/15 1617  Charlestine Night, PA-C 04/13/15 1554  Pricilla Loveless, MD 04/14/15 [phone]
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
## 2018-02-11 ENCOUNTER — Ambulatory Visit: Payer: 59 | Admitting: Adult Health
